# Patient Record
Sex: Female | Born: 1970 | Race: White | Hispanic: No | Marital: Married | Smoking: Never smoker
Health system: Southern US, Community
[De-identification: ages and names within clinical notes are randomized; demographics above are authoritative.]

## PROBLEM LIST (undated history)

## (undated) DIAGNOSIS — N6332 Unspecified lump in axillary tail of the left breast: Secondary | ICD-10-CM

## (undated) DIAGNOSIS — Z800 Family history of malignant neoplasm of digestive organs: Secondary | ICD-10-CM

## (undated) DIAGNOSIS — Z808 Family history of malignant neoplasm of other organs or systems: Secondary | ICD-10-CM

## (undated) DIAGNOSIS — K644 Residual hemorrhoidal skin tags: Secondary | ICD-10-CM

## (undated) DIAGNOSIS — Z803 Family history of malignant neoplasm of breast: Secondary | ICD-10-CM

## (undated) HISTORY — PX: TUBAL LIGATION: SHX77

## (undated) HISTORY — DX: Residual hemorrhoidal skin tags: K64.4

## (undated) HISTORY — DX: Unspecified lump in axillary tail of the left breast: N63.32

## (undated) HISTORY — DX: Family history of malignant neoplasm of other organs or systems: Z80.8

## (undated) HISTORY — DX: Family history of malignant neoplasm of digestive organs: Z80.0

## (undated) HISTORY — DX: Family history of malignant neoplasm of breast: Z80.3

---

## 2000-03-16 ENCOUNTER — Other Ambulatory Visit: Admission: RE | Admit: 2000-03-16 | Discharge: 2000-03-16 | Payer: Self-pay | Admitting: *Deleted

## 2001-03-12 ENCOUNTER — Other Ambulatory Visit: Admission: RE | Admit: 2001-03-12 | Discharge: 2001-03-12 | Payer: Self-pay | Admitting: Obstetrics and Gynecology

## 2001-10-20 ENCOUNTER — Encounter (INDEPENDENT_AMBULATORY_CARE_PROVIDER_SITE_OTHER): Payer: Self-pay | Admitting: Specialist

## 2001-10-20 ENCOUNTER — Inpatient Hospital Stay (HOSPITAL_COMMUNITY): Admission: AD | Admit: 2001-10-20 | Discharge: 2001-10-23 | Payer: Self-pay | Admitting: Obstetrics and Gynecology

## 2001-10-26 ENCOUNTER — Inpatient Hospital Stay (HOSPITAL_COMMUNITY): Admission: AD | Admit: 2001-10-26 | Discharge: 2001-10-26 | Payer: Self-pay | Admitting: Obstetrics and Gynecology

## 2001-11-25 ENCOUNTER — Other Ambulatory Visit: Admission: RE | Admit: 2001-11-25 | Discharge: 2001-11-25 | Payer: Self-pay | Admitting: Obstetrics and Gynecology

## 2003-05-22 ENCOUNTER — Other Ambulatory Visit: Admission: RE | Admit: 2003-05-22 | Discharge: 2003-05-22 | Payer: Self-pay | Admitting: Obstetrics and Gynecology

## 2003-06-24 ENCOUNTER — Encounter: Admission: RE | Admit: 2003-06-24 | Discharge: 2003-06-24 | Payer: Self-pay | Admitting: Obstetrics and Gynecology

## 2005-10-08 ENCOUNTER — Inpatient Hospital Stay (HOSPITAL_COMMUNITY): Admission: AD | Admit: 2005-10-08 | Discharge: 2005-10-08 | Payer: Self-pay | Admitting: *Deleted

## 2005-10-15 ENCOUNTER — Inpatient Hospital Stay (HOSPITAL_COMMUNITY): Admission: AD | Admit: 2005-10-15 | Discharge: 2005-10-18 | Payer: Self-pay | Admitting: Obstetrics and Gynecology

## 2005-10-15 ENCOUNTER — Encounter (INDEPENDENT_AMBULATORY_CARE_PROVIDER_SITE_OTHER): Payer: Self-pay | Admitting: Specialist

## 2005-12-07 ENCOUNTER — Emergency Department (HOSPITAL_COMMUNITY): Admission: EM | Admit: 2005-12-07 | Discharge: 2005-12-07 | Payer: Self-pay | Admitting: Emergency Medicine

## 2007-02-19 ENCOUNTER — Encounter: Admission: RE | Admit: 2007-02-19 | Discharge: 2007-02-19 | Payer: Self-pay | Admitting: *Deleted

## 2008-03-24 ENCOUNTER — Ambulatory Visit: Payer: Self-pay | Admitting: Internal Medicine

## 2008-03-24 DIAGNOSIS — R141 Gas pain: Secondary | ICD-10-CM | POA: Insufficient documentation

## 2008-03-24 DIAGNOSIS — K625 Hemorrhage of anus and rectum: Secondary | ICD-10-CM | POA: Insufficient documentation

## 2008-03-24 DIAGNOSIS — R143 Flatulence: Secondary | ICD-10-CM

## 2008-03-24 DIAGNOSIS — R142 Eructation: Secondary | ICD-10-CM

## 2008-03-25 DIAGNOSIS — R198 Other specified symptoms and signs involving the digestive system and abdomen: Secondary | ICD-10-CM | POA: Insufficient documentation

## 2008-04-13 ENCOUNTER — Ambulatory Visit: Payer: Self-pay | Admitting: Internal Medicine

## 2008-04-14 ENCOUNTER — Telehealth: Payer: Self-pay | Admitting: Internal Medicine

## 2008-12-21 ENCOUNTER — Ambulatory Visit: Payer: Self-pay | Admitting: Internal Medicine

## 2009-01-01 ENCOUNTER — Ambulatory Visit: Payer: Self-pay | Admitting: Internal Medicine

## 2009-01-04 ENCOUNTER — Telehealth (INDEPENDENT_AMBULATORY_CARE_PROVIDER_SITE_OTHER): Payer: Self-pay | Admitting: *Deleted

## 2010-07-28 ENCOUNTER — Other Ambulatory Visit: Payer: Self-pay | Admitting: Dermatology

## 2010-09-23 NOTE — H&P (Signed)
[facility]  Patient:    Mariah Ruiz, Mariah Ruiz Visit Number: [id number] MRN: [id number]          Service Type: OBS Location: [facility] Attending Physician:  Maxie Better Dictated by:   Lenoard Aden, M.D. Admit Date:  10/20/2001                           History and Physical  CHIEF COMPLAINT:              Spontaneous rupture of membranes.  HISTORY OF PRESENT ILLNESS:   The patient is a 40 year old white female, gravida 1, para 0, EDD October 23, 2001, at 39+ weeks who presents with thick meconium and spontaneous rupture of membranes at 3:00 today.  Pregnancy complicated by GBS bacteriuria.  PAST MEDICAL HISTORY:         No medical or surgical hospitalizations.  OBSTETRIC HISTORY:            Noncontributory.  PAST SURGICAL HISTORY:        History of wisdom tooth extraction one year ago.  FAMILY HISTORY:               Of lung, colon, and breast CA.  Family history also of chronic hypertension in mother.  PRENATAL LABORATORY DATA:     Reveals a blood type of A-positive.  Rh antibody negative.  Rubella immune.  Hepatitis B surface negative.  HIV nonreactive. GC and Chlamydia negative.  PHYSICAL EXAMINATION:  GENERAL:                      She is a well-developed and well-nourished white female in no apparent distress.  HEENT:                        Normal.  LUNGS:                        Clear.  HEART:                        Regular rhythm.  ABDOMEN:                      Soft, gravid, and nontender.  Estimated fetal weight 7 pounds.  CERVIX:                       Is fingertip to one, 80% and firm, vertex -1. IUPC placed for amnioinfusion.  Thick meconium noted on examination.  Vertex confirmed by ultrasound.  IMPRESSION:                   Term intrauterine pregnancy with meconium                               spontaneous rupture of membranes.  PLAN:                         Watch fetal heart rate closely, IUPC placed for amnioinfusion,  Pitocin augmentation, penicillin protocol, anticipate attempts at labor induction and vaginal delivery. Dictated by:   Lenoard Aden, M.D. Attending Physician:  Maxie Better DD:  10/20/01 TD:  10/20/01 Job: [id number]

## 2010-09-23 NOTE — Op Note (Signed)
[facility]  Patient:    Mariah Ruiz, Mariah Ruiz Visit Number: [id number] MRN: [id number]          Service Type: OBS Location: 910A 9120 01 Attending Physician:  Maxie Better Dictated by:   Lenoard Aden, M.D. Proc. Date: 10/20/01 Admit Date:  10/20/2001   CC:         [facility]   Operative Report  PREOPERATIVE DIAGNOSES:       A 39-week Intrauterine pregnancy, nonreassuring fetal heart rate, meconium-stained amniotic fluid.  POSTOPERATIVE DIAGNOSES:      A 39-week Intrauterine pregnancy, nonreassuring fetal heart rate, meconium-stained amniotic fluid.  PROCEDURE:                    Urgent low-segment transverse cesarean section.  SURGEON:                      Lenoard Aden, M.D.  ASSISTANT:                    None.  ANESTHESIA:                   Epidural by Hatchett.  ESTIMATED BLOOD LOSS:         1000 cc.  COMPLICATIONS:                None.  DRAINS:                       Foley.  DISPOSITION:                  Patient to recovery in good condition.  FINDINGS:                     Full term living female, occipitoposterior position.  Nuchal cord x3. DeLee suctioning performed. Placenta anterior and removed manually intact. Cord pH of 7.32, Apgars of 7 and 9. Pediatricians in attendance.  DESCRIPTION OF PROCEDURE:     After being appraised of the risks of anesthesia, infection, bleeding, expectant managment versus proceeding with surgical treatment, the patient was brought to the operating room where she was administered a dosing of her epidural anesthetic without complications, prepped and draped in the usual sterile fashion. Foley catheter placed. After achieving adequate anesthesia, dilute Marcaine solution placed in the area of the Pfannenstiel skin incision, a skin incision made with the scalpel, carried down to the fascia, which was nicked in the midline and opened transversely using Mayo scissors.  Rectus muscles  dissected sharply in the midline and the midportion, peritoneum entered sharply. Bladder blade placed. The visceral peritoneum scored in a smiley fashion, uterus scored in a smiley fashion. Delivery of a full-term living female from occipitoposterior position, handed to the pediatricians in attendance, receiving Apgars of 7 and 9. Cord blood collected. Cord pH of 7.32 noted. Nuchal cord x3 was reduced prior to delivery of the fetus. The uterus was exteriorized. Normal tubes and ovaries are noted. The uterus is curetted using a dry lap pack. Bladder flap inspected. The uterus was closed using a 0 Monocryl in a continuous running fashion. A second imbricating layer is placed. Good hemostasis is achieved. Bladder flap is inspected. There is a bleeder in the midline of the uterine incision, which is reinforced using the Monocryl suture. After this, pericolic gutter irrigated. All blood clots were subsequently removed. After achieving adequate hemostasis, the fascia then closed using a 0 Vicryl in  a continuous running fashion. The skin was closed using staples. The patient tolerated the procedure well and was transferred to recovery in good condition. Dictated by:   Lenoard Aden, M.D. Attending Physician:  Maxie Better DD:  10/20/01 TD:  10/22/01 Job: [id number]

## 2010-09-23 NOTE — Discharge Summary (Signed)
NAME:  Mariah Ruiz, Mariah Ruiz NO.:  [id number]   MEDICAL RECORD NO.:  [id number]          PATIENT TYPE:  INP   LOCATION:  9142                          FACILITY:  [facility]   PHYSICIAN:  Lenoard Aden, M.D.DATE OF BIRTH:  [birth year]   DATE OF ADMISSION:  10/15/2005  DATE OF DISCHARGE:  10/18/2005                                 DISCHARGE SUMMARY   ADMISSION DIAGNOSES:  1.  At [redacted] weeks gestation with spontaneous rupture of membranes.  2.  Planned repeat cesarean delivery with tubal ligation.   DISCHARGE DIAGNOSIS:  Postoperative day three status post cesarean section  and bilateral tubal ligation, stable status.   PATIENT PRESENTATION:  Patient presents to maternity admission's unit at  1:35 a.m.  The patient is a gravida 2, para 1, EDC of October 31, 2005 at 37  weeks and 5 days with spontaneous rupture of membranes at 0115.  The patient  is for planned repeat cesarean section with bilateral tubal ligation.  Vital  signs 98.4, pulse 107, respirations 18, blood pressure 136/84.  The patient  has no known drug allergies.  See operative note for delivery information,  repeat cesarean section and bilateral tubal ligation.  The patient delivered  a female, Apgar's 9 and 9.  Birth weight of 6 pounds 11 ounces by Dr. Lenoard Aden.   HOSPITAL COURSE:  Postoperative day one, status post cesarean section and  tubal ligation.  The patient is in stable status.  Vital signs are stable.  The patient is afebrile.   LABORATORY DATA:  On postoperative day one, white blood cell count is 10.8,  hemoglobin is 10.5, hematocrit is 30.6, platelet count is stable at 232.  RPR is nonreactive.   Hospital day two, postoperative day two, up ad lib, tolerating diet,  breastfeeding without difficulty.  Vital signs are stable and afebrile.  Physical exam is normal.   Hospital day three, postoperative day three on the date of discharge, the  patient is stable.  She is three days postoperative  surgery, tolerating  diet, up ad lib, breastfeeding without difficulty.  Physical exam is within  normal limits.  Incision is well approximated.  No redness, no drainage.  Staples are removed.  Steri-Strips applied.  The patient is discharged home  in stable status.   ACTIVITY:  Postoperative precautions.  No heavy lifting.  No driving for the  next two weeks.   DIET:  Regular.   MEDICATIONS:  Include prenatal vitamin once daily, ibuprofen 800 mg every  eight hours as needed for discomfort, Percocet 1-2 tablets every four to six  hours as needed for pain.  The patient is to follow up at [facility] at four weeks postoperative for incision check and  baby blues postpartum depression evaluation.  Discharge instruction booklet  discharged home with patient.  Reviewed.  No questions at discharge.  Newborn is circumcised prior to discharge.  Newborn is in stable status.      Marlinda Mike, C.N.M.      Lenoard Aden, M.D.  Electronically Signed    TB/MEDQ  D:  12/11/2005  T:  12/12/2005  Job:  [id number]

## 2010-09-23 NOTE — Op Note (Signed)
NAME:  Mariah Ruiz, Mariah Ruiz NO.:  [id number]   MEDICAL RECORD NO.:  [id number]          PATIENT TYPE:  INP   LOCATION:  9142                          [facility]ILITY[facility]  [facility]   PHYSICIAN:  Lenoard Aden, M.D.DATE OF BIRTH:  10/02/1970   DATE OF PROCEDURE:  10/15/2005  DATE OF DISCHARGE:                                 OPERATIVE REPORT   PREOPERATIVE DIAGNOSIS:  37-week spontaneous rupture of membranes, previous  cesarean section, for repeat tubal ligation.   POSTOPERATIVE DIAGNOSIS:  37-week spontaneous rupture of membranes, previous  cesarean section, for repeat tubal ligation.   OPERATION PERFORMED:  Repeat low segment transverse cesarean section and  tubal ligation.   SURGEON:  Lenoard Aden, M.D.   ANESTHESIA:  Spinal by Raul Del, M.D.   ESTIMATED BLOOD LOSS:  1000 mL.   COMPLICATIONS:  None.   DRAINS:  Foley catheter.   COUNTS:  Correct.   Patient to recovery in good condition.   FINDINGS:  A full term living female 6 pounds 10 ounces, Apgars 9 and 9,  placenta anterior intact.  Three vessel cord noted.  Normal tubes, normal  ovaries.  Small subserosal fundal fibroid noted.   DESCRIPTION OF PROCEDURE:  After being apprised of risks of anesthesia,  infection, bleeding, injury to abdominal organs, need for repair, delayed  versus immediate complications to include bowel and bladder injury, the  patient was brought to the operating room where she was administered a  spinal anesthetic without complications, prepped and draped in the usual  sterile fashion.  Foley catheter placed.  After achieving adequate  anesthesia, dilute Marcaine solution placed.  Pfannenstiel skin incision  made with a scalpel, carried down to fascia which was nicked in the midline  __________ using Mayo scissors.  Rectus muscles dissected sharply in the  midline.  Peritoneum entered sharply, bladder blade was placed.  Visceral  peritoneum scored sharply, lower uterine segment  curved hysterotomy incision  made, atraumatic delivery full term living female, handed to pediatrician in  attendance, Apgars as noted.  Cord blood collected.  Placenta delivered  manually intact, 3-vessel cord, uterus exteriorized curetted using a dry lap  pack and closed in one layer using a 0 Monocryl suture.  Additional sutures  placed in the left and right lateral edge to obtain hemostasis.  After  obtaining a proper consent once again, failure risks of tubal ligation noted  5 to 10 per 1000, the right tube was then traced out to the right fimbriated  end where it was identified.  Ampullary isthmic portion of the tube was  identified.  Avascular portion of the mesosalpinx was cauterized creating a  window.  0 ties were placed distally and proximally.  Tubal segment removed.  Tubal lumens were visualized and cauterized.  The same exact procedure was  performed on the left tube.  Good hemostasis was noted on both tubal  operative sites.  At this time irrigation was accomplished.  Incision and bladder flap  reinspected.  Good hemostasis appreciated.  Fascia then closed using 0  Monocryl in running fashion.  Skin was closed using staples.  The patient  tolerated the procedure well and is transferred to recovery room in good  condition.      Lenoard Aden, M.D.  Electronically Signed     RJT/MEDQ  D:  10/15/2005  T:  10/16/2005  Job:  [id number]

## 2013-05-23 ENCOUNTER — Other Ambulatory Visit: Payer: Self-pay

## 2013-05-23 DIAGNOSIS — Z1231 Encounter for screening mammogram for malignant neoplasm of breast: Secondary | ICD-10-CM

## 2013-06-13 ENCOUNTER — Ambulatory Visit
Admission: RE | Admit: 2013-06-13 | Discharge: 2013-06-13 | Disposition: A | Discharge status: Home / Self Care | Payer: BC Managed Care – PPO | Source: Ambulatory Visit

## 2013-06-13 DIAGNOSIS — Z1231 Encounter for screening mammogram for malignant neoplasm of breast: Secondary | ICD-10-CM

## 2014-09-04 ENCOUNTER — Encounter: Payer: Self-pay | Admitting: Internal Medicine

## 2015-07-07 ENCOUNTER — Other Ambulatory Visit: Payer: Self-pay | Admitting: Obstetrics and Gynecology

## 2015-07-07 DIAGNOSIS — R928 Other abnormal and inconclusive findings on diagnostic imaging of breast: Secondary | ICD-10-CM

## 2015-07-12 ENCOUNTER — Ambulatory Visit
Admission: RE | Admit: 2015-07-12 | Discharge: 2015-07-12 | Disposition: A | Discharge status: Home / Self Care | Payer: BLUE CROSS/BLUE SHIELD | Source: Ambulatory Visit | Attending: Obstetrics and Gynecology | Admitting: Obstetrics and Gynecology

## 2015-07-12 DIAGNOSIS — R928 Other abnormal and inconclusive findings on diagnostic imaging of breast: Secondary | ICD-10-CM

## 2016-02-25 DIAGNOSIS — F4323 Adjustment disorder with mixed anxiety and depressed mood: Secondary | ICD-10-CM | POA: Diagnosis not present

## 2016-03-03 DIAGNOSIS — F4323 Adjustment disorder with mixed anxiety and depressed mood: Secondary | ICD-10-CM | POA: Diagnosis not present

## 2016-03-10 DIAGNOSIS — F4323 Adjustment disorder with mixed anxiety and depressed mood: Secondary | ICD-10-CM | POA: Diagnosis not present

## 2016-03-24 DIAGNOSIS — F4323 Adjustment disorder with mixed anxiety and depressed mood: Secondary | ICD-10-CM | POA: Diagnosis not present

## 2016-04-21 DIAGNOSIS — F4323 Adjustment disorder with mixed anxiety and depressed mood: Secondary | ICD-10-CM | POA: Diagnosis not present

## 2016-08-15 DIAGNOSIS — Z6820 Body mass index (BMI) 20.0-20.9, adult: Secondary | ICD-10-CM | POA: Diagnosis not present

## 2016-08-15 DIAGNOSIS — Z01419 Encounter for gynecological examination (general) (routine) without abnormal findings: Secondary | ICD-10-CM | POA: Diagnosis not present

## 2016-08-21 ENCOUNTER — Other Ambulatory Visit: Payer: Self-pay | Admitting: Family Medicine

## 2016-08-21 ENCOUNTER — Other Ambulatory Visit: Payer: Self-pay | Admitting: Obstetrics and Gynecology

## 2016-08-21 DIAGNOSIS — R234 Changes in skin texture: Secondary | ICD-10-CM

## 2016-08-28 ENCOUNTER — Encounter: Payer: Self-pay | Admitting: Internal Medicine

## 2016-08-30 ENCOUNTER — Ambulatory Visit
Admission: RE | Admit: 2016-08-30 | Discharge: 2016-08-30 | Disposition: A | Discharge status: Home / Self Care | Payer: BLUE CROSS/BLUE SHIELD | Source: Ambulatory Visit | Attending: Obstetrics and Gynecology | Admitting: Obstetrics and Gynecology

## 2016-08-30 ENCOUNTER — Other Ambulatory Visit: Payer: Self-pay | Admitting: Obstetrics and Gynecology

## 2016-08-30 DIAGNOSIS — N6489 Other specified disorders of breast: Secondary | ICD-10-CM | POA: Diagnosis not present

## 2016-08-30 DIAGNOSIS — R234 Changes in skin texture: Secondary | ICD-10-CM

## 2016-08-30 DIAGNOSIS — R928 Other abnormal and inconclusive findings on diagnostic imaging of breast: Secondary | ICD-10-CM | POA: Diagnosis not present

## 2016-09-27 ENCOUNTER — Encounter: Payer: Self-pay | Admitting: Internal Medicine

## 2016-09-27 ENCOUNTER — Ambulatory Visit (INDEPENDENT_AMBULATORY_CARE_PROVIDER_SITE_OTHER): Payer: BLUE CROSS/BLUE SHIELD | Admitting: Internal Medicine

## 2016-09-27 VITALS — BP 84/60 | HR 82 | Ht 66.0 in | Wt 122.0 lb

## 2016-09-27 DIAGNOSIS — K6289 Other specified diseases of anus and rectum: Secondary | ICD-10-CM

## 2016-09-27 DIAGNOSIS — Z1211 Encounter for screening for malignant neoplasm of colon: Secondary | ICD-10-CM | POA: Diagnosis not present

## 2016-09-27 DIAGNOSIS — Z800 Family history of malignant neoplasm of digestive organs: Secondary | ICD-10-CM

## 2016-09-27 NOTE — Progress Notes (Signed)
HISTORY OF PRESENT ILLNESS:  Mariah Ruiz is a 46 y.o. female who presents with a chief complaint of family history of colon cancer and the need for colon cancer screening. Secondary complaint of intermittent rectal irritation. The patient underwent colonoscopy 01/01/2009 to evaluate minor rectal bleeding and a family history of colon cancer in her grandparent as well as family history of colon polyps in her parents and sister at age 29. The colon and terminal ileum were normal. No polyps. Bleeding was felt likely secondary to hemorrhoids or small tear. No significant abnormalities on examination that day. Unfortunately, her brother, age 19, was recently diagnosed with stage IV colon cancer. He has not been screened. Patient does mention that her bowel habits are regular. She will notice itching discomfort and occasional minor red blood after defecation. She describes a burning or stinging sensation seeming like a small tear. No other complaints except for slight lower abdominal wall discomfort with Valsalva. She questions hernia  REVIEW OF SYSTEMS:  All non-GI ROS negative entirely upon comprehensive review  Past Medical History:  Diagnosis Date  . External hemorrhoids   . Mass of axillary tail of left breast     Past Surgical History:  Procedure Laterality Date  . CESAREAN SECTION    . TUBAL LIGATION      Social History FUMIE FIALLO  reports that she has never smoked. She has never used smokeless tobacco. She reports that she drinks about 1.2 oz of alcohol per week . She reports that she does not use drugs.  family history includes Colon cancer in her paternal grandmother; Colon cancer (age of onset: 49) in her brother; Melanoma in her sister; Stroke in her father; Ulcerative colitis (age of onset: 4) in her son.  Allergies  Allergen Reactions  . Shrimp [Shellfish Allergy]   . Latex Rash    Latex was used with her last delivery and she had no problems       PHYSICAL  EXAMINATION: Vital signs: BP (!) 84/60   Pulse 82   Ht 5\' 6"  (1.676 m)   Wt 122 lb (55.3 kg)   LMP 09/05/2016 (Approximate)   BMI 19.69 kg/m   Constitutional: generally well-appearing, no acute distress Psychiatric: alert and oriented x3, cooperative Eyes: extraocular movements intact, anicteric, conjunctiva pink Mouth: oral pharynx moist, no lesions Neck: supple no lymphadenopathy Cardiovascular: heart regular rate and rhythm, no murmur Lungs: clear to auscultation bilaterally Abdomen: soft, nontender, nondistended, no obvious ascites, no peritoneal signs, normal bowel sounds, no organomegaly. Slight abdominal wall via stasis with Valsalva Rectal:Deferred until colonoscopy Extremities: no clubbing cyanosis or lower extremity edema bilaterally Skin: no lesions on visible extremities Neuro: No focal deficits. Cranial nerves intact  ASSESSMENT:  #1. Family history of colon cancer as described. This patient is appropriate for colon cancer screening #2. Minor rectal irritation. Sounds like small fissure #3. Mild abdominal wall via stasis  PLAN:  #1. Screen colonoscopy.The nature of the procedure, as well as the risks, benefits, and alternatives were carefully and thoroughly reviewed with the patient. Ample time for discussion and questions allowed. The patient understood, was satisfied, and agreed to proceed. #2. Address perirectal complaints post colonoscopy

## 2016-09-27 NOTE — Patient Instructions (Signed)
We will call you to schedule your colonoscopy 

## 2016-11-07 ENCOUNTER — Encounter: Payer: Self-pay | Admitting: Internal Medicine

## 2016-12-25 ENCOUNTER — Ambulatory Visit (AMBULATORY_SURGERY_CENTER): Payer: Self-pay

## 2016-12-25 VITALS — Ht 66.0 in | Wt 122.4 lb

## 2016-12-25 DIAGNOSIS — Z800 Family history of malignant neoplasm of digestive organs: Secondary | ICD-10-CM

## 2016-12-25 MED ORDER — NA SULFATE-K SULFATE-MG SULF 17.5-3.13-1.6 GM/177ML PO SOLN: 1.0000 | Freq: Once | ORAL | 0 refills | Status: AC

## 2016-12-25 NOTE — Progress Notes (Signed)
Denies allergies to eggs or soy products. Denies complication of anesthesia or sedation. Denies use of weight loss medication. Denies use of O2.   Emmi instructions declined.  

## 2016-12-26 ENCOUNTER — Encounter: Payer: Self-pay | Admitting: Internal Medicine

## 2017-01-09 ENCOUNTER — Encounter: Payer: Self-pay | Admitting: Internal Medicine

## 2017-01-09 ENCOUNTER — Ambulatory Visit (AMBULATORY_SURGERY_CENTER): Payer: BLUE CROSS/BLUE SHIELD | Admitting: Internal Medicine

## 2017-01-09 VITALS — BP 101/68 | HR 68 | Temp 98.2°F | Resp 17 | Ht 66.0 in | Wt 122.0 lb

## 2017-01-09 DIAGNOSIS — Z1212 Encounter for screening for malignant neoplasm of rectum: Secondary | ICD-10-CM | POA: Diagnosis not present

## 2017-01-09 DIAGNOSIS — Z800 Family history of malignant neoplasm of digestive organs: Secondary | ICD-10-CM | POA: Diagnosis not present

## 2017-01-09 DIAGNOSIS — Z1211 Encounter for screening for malignant neoplasm of colon: Secondary | ICD-10-CM

## 2017-01-09 MED ORDER — SODIUM CHLORIDE 0.9 % IV SOLN: 500.0000 mL | INTRAVENOUS | Status: DC

## 2017-01-09 NOTE — Progress Notes (Signed)
To recovery, report to RN, VSS. 

## 2017-01-09 NOTE — Progress Notes (Signed)
Pt's states no medical or surgical changes since previsit or office visit. 

## 2017-01-09 NOTE — Op Note (Signed)
[facility] Patient Name: Mariah Ruiz Procedure Date: 01/09/2017 9:17 AM MRN: [id number] Endoscopist: Docia Chuck. Henrene Pastor , MD Age: 46 Referring MD:  Date of Birth: 01-03-1971 Gender: Female Account #: [id number] Procedure:                Colonoscopy Indications:              Screening in patient at increased risk: Colorectal                            cancer in brother before age 68 (age 63); sister                            with polyps in 73s Medicines:                Monitored Anesthesia Care Procedure:                Pre-Anesthesia Assessment:                           - Prior to the procedure, a History and Physical                            was performed, and patient medications and                            allergies were reviewed. The patient's tolerance of                            previous anesthesia was also reviewed. The risks                            and benefits of the procedure and the sedation                            options and risks were discussed with the patient.                            All questions were answered, and informed consent                            was obtained. Prior Anticoagulants: The patient has                            taken no previous anticoagulant or antiplatelet                            agents. ASA Grade Assessment: I - A normal, healthy                            patient. After reviewing the risks and benefits,                            the patient was deemed in satisfactory condition to  undergo the procedure.                           After obtaining informed consent, the colonoscope                            was passed under direct vision. Throughout the                            procedure, the patient's blood pressure, pulse, and                            oxygen saturations were monitored continuously. The                            Colonoscope was introduced through the anus and                         advanced to the the cecum, identified by                            appendiceal orifice and ileocecal valve. The                            ileocecal valve, appendiceal orifice, and rectum                            were photographed. The quality of the bowel                            preparation was excellent. The colonoscopy was                            performed without difficulty. The patient tolerated                            the procedure well. The bowel preparation used was                            SUPREP. Scope In: 9:29:57 AM Scope Out: 9:42:11 AM Scope Withdrawal Time: 0 hours 8 minutes 59 seconds  Total Procedure Duration: 0 hours 12 minutes 14 seconds  Findings:                 A few medium-mouthed diverticula were found in the                            proximal sigmoid colon and ascending colon.                           The exam was otherwise without abnormality on                            direct and retroflexion views. Complications:            No immediate complications. Estimated blood loss:  None. Estimated Blood Loss:     Estimated blood loss: none. Impression:               - Diverticulosis in the proximal sigmoid colon and                            in the ascending colon.                           - The examination was otherwise normal on direct                            and retroflexion views.                           - Small anal fissure. Recommendation:           - Repeat colonoscopy in 5 years for screening                            purposes.                           - Patient has a contact number available for                            emergencies. The signs and symptoms of potential                            delayed complications were discussed with the                            patient. Return to normal activities tomorrow.                            Written discharge instructions were provided  to the                            patient.                           - Resume previous diet.                           - Recommend fiber supplementation with Metamucil                            one to 2 tablespoons daily in 12-14 ounces of water                            or juice. Docia Chuck. Henrene Pastor, MD 01/09/2017 9:47:51 AM This report has been signed electronically.

## 2017-01-09 NOTE — Patient Instructions (Signed)
YOU HAD AN ENDOSCOPIC PROCEDURE TODAY AT [facility]:   Refer to the procedure report that was given to you for any specific questions about what was found during the examination.  If the procedure report does not answer your questions, please call your gastroenterologist to clarify.  If you requested that your care partner not be given the details of your procedure findings, then the procedure report has been included in a sealed envelope for you to review at your convenience later.  YOU SHOULD EXPECT: Some feelings of bloating in the abdomen. Passage of more gas than usual.  Walking can help get rid of the air that was put into your GI tract during the procedure and reduce the bloating. If you had a lower endoscopy (such as a colonoscopy or flexible sigmoidoscopy) you may notice spotting of blood in your stool or on the toilet paper. If you underwent a bowel prep for your procedure, you may not have a normal bowel movement for a few days.  Please Note:  You might notice some irritation and congestion in your nose or some drainage.  This is from the oxygen used during your procedure.  There is no need for concern and it should clear up in a day or so.  SYMPTOMS TO REPORT IMMEDIATELY:   Following lower endoscopy (colonoscopy or flexible sigmoidoscopy):  Excessive amounts of blood in the stool  Significant tenderness or worsening of abdominal pains  Swelling of the abdomen that is new, acute  Fever of 100F or higher  For urgent or emergent issues, a gastroenterologist can be reached at any hour by calling [phone].   DIET:  We do recommend a small meal at first, but then you may proceed to your regular diet.  Drink plenty of fluids but you should avoid alcoholic beverages for 24 hours.  MEDICATIONS: Recommend fiber supplementation with Metamucil 1 to 2 tablespoons daily in 12-14 ounces of water or juice.  Please see handouts given to you by your recovery  nurse.  ACTIVITY:  You should plan to take it easy for the rest of today and you should NOT DRIVE or use heavy machinery until tomorrow (because of the sedation medicines used during the test).    FOLLOW UP: Our staff will call the number listed on your records the next business day following your procedure to check on you and address any questions or concerns that you may have regarding the information given to you following your procedure. If we do not reach you, we will leave a message.  However, if you are feeling well and you are not experiencing any problems, there is no need to return our call.  We will assume that you have returned to your regular daily activities without incident.  If any biopsies were taken you will be contacted by phone or by letter within the next 1-3 weeks.  Please call us at [phone] if you have not heard about the biopsies in 3 weeks.   Thank you for allowing [place] to provide for your healthcare needs today.  SIGNATURES/CONFIDENTIALITY: You and/or your care partner have signed paperwork which will be entered into your electronic medical record.  These signatures attest to the fact that that the information above on your After Visit Summary has been reviewed and is understood.  Full responsibility of the confidentiality of this discharge information lies with you and/or your care-partner.

## 2017-01-10 ENCOUNTER — Telehealth: Payer: Self-pay | Admitting: *Deleted

## 2017-01-10 ENCOUNTER — Telehealth: Payer: Self-pay

## 2017-01-10 NOTE — Telephone Encounter (Signed)
Called [phone] and left a messaged we tried to reach pt for a follow up call. maw

## 2017-01-10 NOTE — Telephone Encounter (Signed)
  Follow up Call-  Call back number 01/09/2017  Post procedure Call Back phone  # [phone]  Permission to leave phone message Yes  Some recent data might be hidden     Patient questions:  Do you have a fever, pain , or abdominal swelling? No. Pain Score  0 *  Have you tolerated food without any problems? Yes.    Have you been able to return to your normal activities? Yes.    Do you have any questions about your discharge instructions: Diet   No. Medications  No. Follow up visit  No.  Do you have questions or concerns about your Care? No.  Actions: * If pain score is 4 or above: No action needed, pain <4.

## 2017-08-20 DIAGNOSIS — Z6820 Body mass index (BMI) 20.0-20.9, adult: Secondary | ICD-10-CM | POA: Diagnosis not present

## 2017-08-20 DIAGNOSIS — Z01419 Encounter for gynecological examination (general) (routine) without abnormal findings: Secondary | ICD-10-CM | POA: Diagnosis not present

## 2017-08-21 DIAGNOSIS — Z13220 Encounter for screening for lipoid disorders: Secondary | ICD-10-CM | POA: Diagnosis not present

## 2017-08-21 DIAGNOSIS — N926 Irregular menstruation, unspecified: Secondary | ICD-10-CM | POA: Diagnosis not present

## 2017-08-21 DIAGNOSIS — Z0000 Encounter for general adult medical examination without abnormal findings: Secondary | ICD-10-CM | POA: Diagnosis not present

## 2017-09-03 ENCOUNTER — Other Ambulatory Visit: Payer: Self-pay | Admitting: Obstetrics and Gynecology

## 2017-09-03 DIAGNOSIS — Z1231 Encounter for screening mammogram for malignant neoplasm of breast: Secondary | ICD-10-CM

## 2017-09-19 ENCOUNTER — Ambulatory Visit: Payer: BLUE CROSS/BLUE SHIELD

## 2018-01-31 ENCOUNTER — Ambulatory Visit
Admission: RE | Admit: 2018-01-31 | Discharge: 2018-01-31 | Disposition: A | Discharge status: Home / Self Care | Payer: BLUE CROSS/BLUE SHIELD | Source: Ambulatory Visit | Attending: Obstetrics and Gynecology | Admitting: Obstetrics and Gynecology

## 2018-01-31 DIAGNOSIS — D225 Melanocytic nevi of trunk: Secondary | ICD-10-CM | POA: Diagnosis not present

## 2018-01-31 DIAGNOSIS — Z1231 Encounter for screening mammogram for malignant neoplasm of breast: Secondary | ICD-10-CM

## 2018-01-31 DIAGNOSIS — D2261 Melanocytic nevi of right upper limb, including shoulder: Secondary | ICD-10-CM | POA: Diagnosis not present

## 2018-01-31 DIAGNOSIS — D224 Melanocytic nevi of scalp and neck: Secondary | ICD-10-CM | POA: Diagnosis not present

## 2018-01-31 DIAGNOSIS — D2262 Melanocytic nevi of left upper limb, including shoulder: Secondary | ICD-10-CM | POA: Diagnosis not present

## 2018-02-18 ENCOUNTER — Other Ambulatory Visit: Payer: Self-pay | Admitting: *Deleted

## 2018-02-18 DIAGNOSIS — Z800 Family history of malignant neoplasm of digestive organs: Secondary | ICD-10-CM

## 2018-02-18 NOTE — Progress Notes (Unsigned)
Brother with stage 4 colon cancer and +genetic screen (BRCA?) Pt wants genetic testing.

## 2018-03-27 ENCOUNTER — Ambulatory Visit: Payer: BLUE CROSS/BLUE SHIELD

## 2018-03-27 DIAGNOSIS — Z803 Family history of malignant neoplasm of breast: Secondary | ICD-10-CM | POA: Diagnosis not present

## 2018-03-27 DIAGNOSIS — Z808 Family history of malignant neoplasm of other organs or systems: Secondary | ICD-10-CM | POA: Diagnosis not present

## 2018-03-27 DIAGNOSIS — Z1379 Encounter for other screening for genetic and chromosomal anomalies: Secondary | ICD-10-CM

## 2018-03-27 DIAGNOSIS — Z800 Family history of malignant neoplasm of digestive organs: Secondary | ICD-10-CM | POA: Diagnosis not present

## 2018-03-27 NOTE — Progress Notes (Signed)
Pt here for [facility] blood draw. Blood draw and pt is aware we will call with results

## 2018-04-03 ENCOUNTER — Encounter: Payer: Self-pay | Admitting: Obstetrics & Gynecology

## 2018-04-03 ENCOUNTER — Telehealth: Payer: Self-pay | Admitting: Obstetrics & Gynecology

## 2018-04-03 NOTE — Telephone Encounter (Signed)
Reviewed family history with Mariah Ruiz today.  Family history tab updated.  Pt has had My Risk test through [facility] and awaiting results.

## 2018-04-17 ENCOUNTER — Encounter: Payer: Self-pay | Admitting: *Deleted

## 2018-04-17 NOTE — Progress Notes (Signed)
Copy of My Risk [facility] genetic testing received and routed to Dr Gala Romney.  Pt copy mailed to pt's home address.

## 2018-04-19 ENCOUNTER — Other Ambulatory Visit: Payer: Self-pay | Admitting: *Deleted

## 2018-04-19 DIAGNOSIS — Z809 Family history of malignant neoplasm, unspecified: Secondary | ICD-10-CM

## 2018-04-22 NOTE — Progress Notes (Signed)
Pt My Risk results are positive and Pt needs genetic counseling appt @ [facility] and appt to be made with Dr Donne Hazel.  Orders for the referral made.

## 2018-06-18 ENCOUNTER — Encounter: Payer: Self-pay | Admitting: Genetic Counselor

## 2018-07-01 ENCOUNTER — Encounter: Payer: Self-pay | Admitting: Genetic Counselor

## 2018-07-01 ENCOUNTER — Inpatient Hospital Stay: Payer: BLUE CROSS/BLUE SHIELD | Attending: Genetic Counselor | Admitting: Genetic Counselor

## 2018-07-01 DIAGNOSIS — Z808 Family history of malignant neoplasm of other organs or systems: Secondary | ICD-10-CM | POA: Diagnosis not present

## 2018-07-01 DIAGNOSIS — Z800 Family history of malignant neoplasm of digestive organs: Secondary | ICD-10-CM

## 2018-07-01 DIAGNOSIS — Z803 Family history of malignant neoplasm of breast: Secondary | ICD-10-CM | POA: Diagnosis not present

## 2018-07-01 NOTE — Progress Notes (Addendum)
REFERRING PROVIDER: Guss Bunde, MD [address], [place] [address]  PRIMARY PROVIDER: Brien Few, MD  PRIMARY REASON FOR VISIT:  1. Family history of breast cancer   2. Family history of melanoma   3. Family history of colon cancer      HISTORY OF PRESENT ILLNESS:   Mariah Ruiz, a 48 y.o. female, was seen for a [place] cancer genetics consultation at the request of Dr. Gala Romney due to a family history of cancer.  Mariah Ruiz presents to clinic today to discuss the possibility of a hereditary predisposition to cancer, genetic testing, and to further clarify her future cancer risks, as well as potential cancer risks for family members.   Mariah Ruiz is a 48 y.o. female with no personal history of cancer.  She underwent genetic testing at Dr. Claiborne Billings Leggett's office through [facility] panel and was identified with an RNF43 pathogenic mutation.    CANCER HISTORY:   No history exists.     HORMONAL RISK FACTORS:  Menarche was at age 3.  First live birth at age 83.  OCP use for approximately 0 years.  Ovaries intact: yes.  Hysterectomy: no.  Menopausal status: premenopausal.  HRT use: 0 years. Colonoscopy: yes; normal. Mammogram within the last year: yes. Number of breast biopsies: 0. Up to date with pelvic exams:  yes. Any excessive radiation exposure in the past:  no  Past Medical History:  Diagnosis Date  . External hemorrhoids   . Family history of breast cancer   . Family history of colon cancer   . Family history of melanoma   . Mass of axillary tail of left breast     Past Surgical History:  Procedure Laterality Date  . CESAREAN SECTION    . TUBAL LIGATION      Social History   Socioeconomic History  . Marital status: Married    Spouse name: Not on file  . Number of children: Not on file  . Years of education: Not on file  . Highest education level: Not on file  Occupational History  . Not on file  Social Needs  .  Financial resource strain: Not on file  . Food insecurity:    Worry: Not on file    Inability: Not on file  . Transportation needs:    Medical: Not on file    Non-medical: Not on file  Tobacco Use  . Smoking status: Never Smoker  . Smokeless tobacco: Never Used  Substance and Sexual Activity  . Alcohol use: Yes    Alcohol/week: 2.0 standard drinks    Types: 2 Standard drinks or equivalent per week    Comment: socially  . Drug use: No  . Sexual activity: Yes  Lifestyle  . Physical activity:    Days per week: Not on file    Minutes per session: Not on file  . Stress: Not on file  Relationships  . Social connections:    Talks on phone: Not on file    Gets together: Not on file    Attends religious service: Not on file    Active member of club or organization: Not on file    Attends meetings of clubs or organizations: Not on file    Relationship status: Not on file  Other Topics Concern  . Not on file  Social History Narrative  . Not on file     FAMILY HISTORY:  We obtained a detailed, 4-generation family history.  Significant diagnoses are  listed below: Family History  Problem Relation Age of Onset  . Stroke Father   . Colon polyps Father   . Melanoma Sister 37  . Colon cancer Paternal Grandmother 54  . Colon cancer Brother 48       stage 4  . Ulcerative colitis Son 8  . Colitis Son   . Breast cancer Paternal Aunt 72  . Cancer Maternal Grandmother        unknown  . Breast cancer Maternal Aunt 37  . Breast cancer Maternal Aunt 60  . Breast cancer Paternal Aunt 25  . Esophageal cancer Neg Hx   . Pancreatic cancer Neg Hx   . Rectal cancer Neg Hx   . Stomach cancer Neg Hx     The patient has two sons who are cancer free, however, her oldest son has ulcerative colitis diagnosed at age 74.  She has a brother and sister.  Her brother was diagnosed with Stage IV colon cancer at age 41 and her sister was diagnosed with melanoma at 48.  Her brother reportedly has had  Lynch syndrome testing, at least in his tumor, which was negative, but did indicate there was a BRAF mutation.  Her sister never had genetic testing for her melanoma.  Both parents are living.  The patient's mother is 12 and has not had cancer.  She has three sisters and two brothers.  Two sister have had breast cancer, both in their mid to late 67's.  One is deceased and the other reportedly has a BRCA mutation.  The maternal grandparents are deceased, the grandmother from possible lung cancer and the grandfather from an accident.  The patient's father is living and has a history of colon polyps.  He has two brothers and three sister.  Two of his sisters had breast cancer in their 84's.  His mother died of colon cancer at 28 and his father died young from Hepatitis and liver failure.  [name] is aware of previous family history of genetic testing for hereditary cancer risks. Patient's ancestors are of [place], [place] and [place] descent. There is no reported Ashkenazi Jewish ancestry. There is no known consanguinity.  GENETIC COUNSELING ASSESSMENT: Mariah Ruiz is a 48 y.o. female with a family history of colon and breast cancer, and melanoma which is somewhat suggestive of a hereditary cancer syndrome and predisposition to cancer. She underwent genetic testing through [facility] panel testing.  The [facility] gene panel offered by [employer] includes sequencing and deletion/duplication testing of the following 35 genes: APC, ATM, AXIN2, BARD1, BMPR1A, BRCA1, BRCA2, BRIP1, CHD1, CDK4, CDKN2A, CHEK2, EPCAM (large rearrangement only), HOXB13, (sequencing only), GALNT12, MLH1, MSH2, MSH3 (excluding repetitive portions of exon 1), MSH6, MUTYH, NBN, NTHL1, PALB2, PMS2, PTEN, RAD51C, RAD51D, RNF43, RPS20, SMAD4, STK11, and TP53. Sequencing was performed for select regions of POLE and POLD1, and large rearrangement analysis was performed for select regions of GREM1. 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  We, therefore, discussed and recommended the following at today's visit.   DISCUSSION: We discussed her risk for breast cancer, colon cancer and Melanoma.  These conversations have been separated based on the risk for cancer in which we discussed.  Colon Cancer: The patient has a brother and paternal grandmother who were diagnosed with colon cancer at a young age of onset, as well as her father who has had colon polyps.  About 5-7% of colon cancer is hereditary, with most cases due to  Lynch syndrome.  The patient reports that her brother had genetic testing for Lynch syndrome in his tumor which indicated that he did not have Lynch syndrome, but did find a BRAF mutation.  We discussed that tumor testing can identify about 90% of cases of Lynch syndrome through the tumor but that about 10% of cases go undiagnosed through the tumor.  Based on his young age of onset and her paternal grandmother's young age of onset, he should consider genetic testing for Lynch syndrome.  Mariah Ruiz had genetic testing through [facility] panel testing that did not identify a genetic mutation within the Lynch syndrome genes.  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  SPS is typically a clinical diagnosis based on the number, size and location of colon polyps.  Mariah Ruiz has had colonoscopies in the past that have been clear with no polyps.  Therefore, based on NCCN guidelines, we would not recommend screening differences from those she is currently undergoing based on a family history of early onset colon cancer.  According to v. 3.2019 NCCN guidelines (AffordableShare.com.br.pdf), a clinical diagnosis of serrated polyposis is considered in an individual who meets at least one of the following empiric criteria:  1. At least 5 serrated polyps proximal  to the sigmoid colon with 2 or more of these being >10 mm 2. Any number of serrated polyps proximal to the sigmoid colon in an individual who has a first-degree relative with serrated polyposis,  3. >/= 20 serrated polyps of any size, but distributed throughout the colon.  For the majority of patients with SPS, no causative gene is identifiable.  Pathogenic variants in RNF43 have been identified as a rare cause of serrated polyposis.  The risk for colon cancer in this syndrome is elevated, although the precise risk remains to be defined. Occasionally, more than one affected case of serrated polyposis is seen in a family, and therefore family members should undergo colon screening.  Surveillance recommendations for individuals with serrated polyposis include:  1. Colonoscopy with polypectomy until all polyps >/= 5 mm are removed, then colonoscopy every 1-3 years depending on the number and size of polyps.  Clearing of all polyps is preferable but not always possible. 2. Consider surgical referral if colonoscopic treatment and/or surveillance is inadequate or if high-grade dysplasia occurs.  Surveillance recommendations for individuals with a family history of serrated polyposis:  1. The risk of CRC in first-degree relatives of individuals with serrated polyposis is elevated. 2. First degree relatives are encouraged to have a colonoscopy at the earliest of the following:  Aged 28 years  Same age as youngest diagnosis of serrated polyposis if uncomplicated by cancer  10 years earlier than earliest diagnosis in family of CRC complicating serrated polyposis.  Following baseline exam, repeat every 5 years if no polyps are found.  If proximal serrated polyps or multiple adenomas are found, consider colonoscopy every 1-3 years.  Melanoma:  We discussed that about 5-10% of melanoma cases are hereditary with most cases due to CDKN2A mutations. As with breast cancer and other cancer syndromes, we look  for multiple diagnosis in a family or individual along with young ages of onset.  Her sister was diagnosed with melanoma at age 66.  This is young, even in a 'sun worshipper".  Melanoma syndromes can fall into Melanoma dominant (Melanoma is the primary cancer in the syndrome) and Melanoma subordinate (Melanoma can be seen in some families, but it  is not a primary cancer in the syndrome).  The [facility] panel looked at a few melanoma genes, but missed several.  Mariah Ruiz is negative for the CDKN2A gene.  There are several other genes that we would look at that are melanoma dominant - including BAP1, MITF and POT1.  We discussed that the best person to test for these is her sister, who was diagnosed so young.  There are no current testing guidelines for melanoma syndromes, so genetic testing for these conditions may or may not be covered by her insurance.  A benefits investigation will determine if her out of pocket cost will be high or if her insurance would cover genetic testing.  Breast Cancer: We discussed that about 5-10% of breast cancer is hereditary with most cases due to BRCA mutations.  Mariah Ruiz tested negative for deleterious mutations within BRCA1 or BRCA2, however, she thinks her maternal aunt had genetic testing that found a BRCA mutation.  She is unsure if that mutation is in BRCA1 or BRCA2.  We discussed that genetic testing results can be positive, negative or variant of uncertain significance (VUS).  If her aunt had a VUS, then this reportedly BRCA mutation is inconsequential at this time.  If it truly is a mutation then her mother and sister need to undergo genetic testing, as well as other family members.  We discussed that there is an elevated risk for breast cancer based on that potential mutation.  We addressed the issue that if the testing found a BRCA VUS then there would still be a residual risk for breast cancer based on the family history.  [facility] performs genetic screening for  breast cancer risk, using genetic factors identified in the patient's blood. This score indicates that she is at a higher risk for breast cancer, despite that she had negative genetic testing.  To help clarify this risk we performed a Harriett Rush as well.  Based on the patient's personal and family history, statistical models (Breast Cancer RiskScore ([facility]) and [employer])  and literature data were used to estimate her risk of developing breast cancer. These estimate her lifetime risk of developing breast cancer to be approximately 20.4% to 27.5%. The patient's lifetime breast cancer risk is a preliminary estimate based on available information using one of several models endorsed by the [place] (ACS). The ACS recommends consideration of breast MRI screening as an adjunct to mammography for patients at high risk (defined as 20% or greater lifetime risk). A more detailed breast cancer risk assessment can be considered, if clinically indicated.   Mariah Ruiz has been determined to be at high risk for breast cancer.  Therefore, we recommend that she consider annual screening with mammography and breast MRI.  We will refer Mariah Ruiz to the high risk breast clinic at [facility].  Someone will call and set that appointment up with her.  We discussed that Mariah Ruiz should discuss her individual situation with her referring physician and determine a breast cancer screening plan with which they are both comfortable.       RECOMMENDATIONS FOR FAMILY MEMBERS: Now that we know a specific deleterious mutation within the family, both men and women should be tested for this condition.  Her siblings and parents have a 50% chance of having this same mutation.  Single site testing can help sort out who in the family has the mutation and therefore needs increased screening, and those who do not.  That said, based  on the family history of colon cancer, individuals may need to be  followed more closely based on the family history, regardless of the genetic test results.    Based on Mariah Ruiz's family history, we recommended her mother have genetic counseling and testing for the possible BRCA mutation in the patient's maternal aunt, her brother, sister and parents undergo genetic testing for the RNF43 mutation identified in Mariah Ruiz, and her sister undergo genetic counseling and testing for possible melanoma genes. Mariah Ruiz will let us know if we can be of any assistance in coordinating genetic counseling and/or testing for this family member.   We discussed that some people do not want to undergo genetic testing due to fear of genetic discrimination.  A federal law called the Genetic Information Non-Discrimination Act (GINA) of 2008 helps protect individuals against genetic discrimination based on their genetic test results.  It impacts both health insurance and employment.  With health insurance, it protects against increased premiums, being kicked off insurance or being forced to take a test in order to be insured.  For employment it protects against hiring, firing and promoting decisions based on genetic test results.  Health status due to a cancer diagnosis is not protected under GINA.  PLAN: We discussed that Mariah Ruiz could:   1. Undergo genetic testing for melanoma genes through [facility].  Once this testing is completed, we could reflex to a larger genetic panel to cover the RNF43 gene.  This would allow family members to undergo genetic TESTING for free through [facility]'s family testing policy. [facility]'s testing policy does not influence the cost of the medical appointment to get genetic counseling and/or the blood drawn.  There could be a cost for that appointment.  Despite our recommendation, Mariah Ruiz did not wish to pursue genetic testing at today's visit. We understand this decision, and remain available to coordinate genetic testing at any time in the future.  We, therefore, recommend Mariah Ruiz continue to follow the cancer screening guidelines given by her primary healthcare provider.  2. Mariah Ruiz should try to get a copy of her aunt's genetic testing.  This would allow for [place] to more fully assess the family risk for breast cancer.  3. Mariah Ruiz should consider going to the High Risk Breast clinic based on her [facility] RiskScore and [place]. We will make this referral.  Lastly, we encouraged Ms. Widmann to remain in contact with cancer genetics annually so that we can continuously update the family history and inform her of any changes in cancer genetics and testing that may be of benefit for this family.   Ms.  Duarte questions were answered to her satisfaction today. Our contact information was provided should additional questions or concerns arise. Thank you for the referral and allowing [place] to share in the care of your patient.   Nachman Sundt P. Florene Glen, [place], [facility] Certified Genetic Counselor [email or web address] phone: [phone]  The patient was seen for a total of 138 minutes in face-to-face genetic counseling.  This patient was discussed with Drs. Magrinat, Lindi Adie and/or Burr Medico who agrees with the above.    _______________________________________________________________________ For Office Staff:  Number of people involved in session: 1 Was an Intern/ student involved with case: yes; Vanita Panda

## 2018-07-02 ENCOUNTER — Encounter: Payer: Self-pay | Admitting: Obstetrics & Gynecology

## 2018-07-02 ENCOUNTER — Encounter: Payer: Self-pay | Admitting: Genetic Counselor

## 2018-07-02 DIAGNOSIS — D126 Benign neoplasm of colon, unspecified: Secondary | ICD-10-CM | POA: Insufficient documentation

## 2018-07-05 ENCOUNTER — Telehealth: Payer: Self-pay | Admitting: Genetic Counselor

## 2018-07-05 NOTE — Telephone Encounter (Signed)
Lft vm to schedule Mariah Ruiz to be seen in the high risk breast clinic.

## 2018-11-28 ENCOUNTER — Other Ambulatory Visit: Payer: Self-pay | Admitting: General Practice

## 2018-11-28 DIAGNOSIS — Z20822 Contact with and (suspected) exposure to covid-19: Secondary | ICD-10-CM

## 2018-11-28 NOTE — Progress Notes (Signed)
n

## 2018-12-01 LAB — NOVEL CORONAVIRUS, NAA: SARS-CoV-2, NAA: NOT DETECTED

## 2019-01-14 ENCOUNTER — Other Ambulatory Visit: Payer: Self-pay | Admitting: Obstetrics and Gynecology

## 2019-01-14 DIAGNOSIS — Z1231 Encounter for screening mammogram for malignant neoplasm of breast: Secondary | ICD-10-CM

## 2019-02-26 ENCOUNTER — Other Ambulatory Visit: Payer: Self-pay

## 2019-02-26 ENCOUNTER — Ambulatory Visit
Admission: RE | Admit: 2019-02-26 | Discharge: 2019-02-26 | Disposition: A | Discharge status: Home / Self Care | Payer: BC Managed Care – PPO | Source: Ambulatory Visit | Attending: Obstetrics and Gynecology | Admitting: Obstetrics and Gynecology

## 2019-02-26 DIAGNOSIS — Z1231 Encounter for screening mammogram for malignant neoplasm of breast: Secondary | ICD-10-CM

## 2019-06-03 ENCOUNTER — Ambulatory Visit: Payer: BC Managed Care – PPO | Attending: Internal Medicine

## 2019-06-03 DIAGNOSIS — Z20822 Contact with and (suspected) exposure to covid-19: Secondary | ICD-10-CM | POA: Diagnosis not present

## 2019-06-04 LAB — NOVEL CORONAVIRUS, NAA: SARS-CoV-2, NAA: NOT DETECTED

## 2019-06-19 DIAGNOSIS — Z01419 Encounter for gynecological examination (general) (routine) without abnormal findings: Secondary | ICD-10-CM | POA: Diagnosis not present

## 2019-06-19 DIAGNOSIS — Z6820 Body mass index (BMI) 20.0-20.9, adult: Secondary | ICD-10-CM | POA: Diagnosis not present

## 2019-06-19 DIAGNOSIS — N926 Irregular menstruation, unspecified: Secondary | ICD-10-CM | POA: Diagnosis not present

## 2019-06-19 DIAGNOSIS — Z1151 Encounter for screening for human papillomavirus (HPV): Secondary | ICD-10-CM | POA: Diagnosis not present

## 2019-07-01 DIAGNOSIS — N950 Postmenopausal bleeding: Secondary | ICD-10-CM | POA: Diagnosis not present

## 2019-09-23 DIAGNOSIS — H5702 Anisocoria: Secondary | ICD-10-CM | POA: Diagnosis not present

## 2019-09-23 DIAGNOSIS — R519 Headache, unspecified: Secondary | ICD-10-CM | POA: Diagnosis not present

## 2020-02-17 DIAGNOSIS — Z0000 Encounter for general adult medical examination without abnormal findings: Secondary | ICD-10-CM | POA: Diagnosis not present

## 2020-03-15 DIAGNOSIS — N951 Menopausal and female climacteric states: Secondary | ICD-10-CM | POA: Diagnosis not present

## 2020-03-15 DIAGNOSIS — Z13220 Encounter for screening for lipoid disorders: Secondary | ICD-10-CM | POA: Diagnosis not present

## 2020-03-18 ENCOUNTER — Other Ambulatory Visit: Payer: Self-pay | Admitting: Obstetrics and Gynecology

## 2020-03-18 DIAGNOSIS — Z1231 Encounter for screening mammogram for malignant neoplasm of breast: Secondary | ICD-10-CM

## 2020-04-09 DIAGNOSIS — Z1159 Encounter for screening for other viral diseases: Secondary | ICD-10-CM | POA: Diagnosis not present

## 2020-04-16 DIAGNOSIS — Z1152 Encounter for screening for COVID-19: Secondary | ICD-10-CM | POA: Diagnosis not present

## 2020-04-28 ENCOUNTER — Ambulatory Visit: Payer: BC Managed Care – PPO

## 2020-06-07 DIAGNOSIS — F419 Anxiety disorder, unspecified: Secondary | ICD-10-CM | POA: Diagnosis not present

## 2020-06-08 ENCOUNTER — Other Ambulatory Visit: Payer: Self-pay

## 2020-06-08 ENCOUNTER — Ambulatory Visit
Admission: RE | Admit: 2020-06-08 | Discharge: 2020-06-08 | Disposition: A | Discharge status: Home / Self Care | Payer: BC Managed Care – PPO | Source: Ambulatory Visit | Attending: Obstetrics and Gynecology | Admitting: Obstetrics and Gynecology

## 2020-06-08 DIAGNOSIS — Z1231 Encounter for screening mammogram for malignant neoplasm of breast: Secondary | ICD-10-CM

## 2020-06-14 DIAGNOSIS — D2239 Melanocytic nevi of other parts of face: Secondary | ICD-10-CM | POA: Diagnosis not present

## 2020-06-14 DIAGNOSIS — L570 Actinic keratosis: Secondary | ICD-10-CM | POA: Diagnosis not present

## 2020-06-14 DIAGNOSIS — D224 Melanocytic nevi of scalp and neck: Secondary | ICD-10-CM | POA: Diagnosis not present

## 2020-06-14 DIAGNOSIS — L814 Other melanin hyperpigmentation: Secondary | ICD-10-CM | POA: Diagnosis not present

## 2020-06-24 DIAGNOSIS — F419 Anxiety disorder, unspecified: Secondary | ICD-10-CM | POA: Diagnosis not present

## 2020-09-23 ENCOUNTER — Encounter: Payer: Self-pay | Admitting: *Deleted

## 2020-09-23 NOTE — Progress Notes (Signed)
Ammended My Risk lab mailed to pt's home address.

## 2020-11-30 DIAGNOSIS — Z113 Encounter for screening for infections with a predominantly sexual mode of transmission: Secondary | ICD-10-CM | POA: Diagnosis not present

## 2020-11-30 DIAGNOSIS — Z01419 Encounter for gynecological examination (general) (routine) without abnormal findings: Secondary | ICD-10-CM | POA: Diagnosis not present

## 2020-11-30 DIAGNOSIS — Z124 Encounter for screening for malignant neoplasm of cervix: Secondary | ICD-10-CM | POA: Diagnosis not present

## 2020-11-30 DIAGNOSIS — N941 Unspecified dyspareunia: Secondary | ICD-10-CM | POA: Diagnosis not present

## 2020-11-30 DIAGNOSIS — Z6820 Body mass index (BMI) 20.0-20.9, adult: Secondary | ICD-10-CM | POA: Diagnosis not present

## 2020-11-30 DIAGNOSIS — N950 Postmenopausal bleeding: Secondary | ICD-10-CM | POA: Diagnosis not present

## 2021-01-25 DIAGNOSIS — N950 Postmenopausal bleeding: Secondary | ICD-10-CM | POA: Diagnosis not present

## 2021-03-16 ENCOUNTER — Ambulatory Visit: Payer: Self-pay | Admitting: Podiatry

## 2021-04-02 DIAGNOSIS — R051 Acute cough: Secondary | ICD-10-CM | POA: Diagnosis not present

## 2021-10-31 DIAGNOSIS — Z1231 Encounter for screening mammogram for malignant neoplasm of breast: Secondary | ICD-10-CM | POA: Diagnosis not present

## 2021-12-02 DIAGNOSIS — Z6820 Body mass index (BMI) 20.0-20.9, adult: Secondary | ICD-10-CM | POA: Diagnosis not present

## 2021-12-02 DIAGNOSIS — Z01419 Encounter for gynecological examination (general) (routine) without abnormal findings: Secondary | ICD-10-CM | POA: Diagnosis not present

## 2021-12-28 ENCOUNTER — Encounter: Payer: Self-pay | Admitting: Internal Medicine

## 2022-01-17 ENCOUNTER — Encounter: Payer: Self-pay | Admitting: Podiatry

## 2022-01-17 ENCOUNTER — Ambulatory Visit (INDEPENDENT_AMBULATORY_CARE_PROVIDER_SITE_OTHER): Payer: BC Managed Care – PPO | Admitting: Podiatry

## 2022-01-17 ENCOUNTER — Ambulatory Visit (INDEPENDENT_AMBULATORY_CARE_PROVIDER_SITE_OTHER): Payer: BC Managed Care – PPO

## 2022-01-17 DIAGNOSIS — M2012 Hallux valgus (acquired), left foot: Secondary | ICD-10-CM

## 2022-01-17 DIAGNOSIS — M2010 Hallux valgus (acquired), unspecified foot: Secondary | ICD-10-CM

## 2022-01-17 DIAGNOSIS — L603 Nail dystrophy: Secondary | ICD-10-CM | POA: Diagnosis not present

## 2022-01-17 DIAGNOSIS — M2011 Hallux valgus (acquired), right foot: Secondary | ICD-10-CM | POA: Diagnosis not present

## 2022-01-17 NOTE — Progress Notes (Signed)
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 female presents with the above complaint.   ROS: Denies fever chills nausea vomiting muscle aches pains calf pain back pain chest pain shortness of breath.  Past Medical History:  Diagnosis Date   External hemorrhoids    Family history of breast cancer    Family history of colon cancer    Family history of melanoma    Mass of axillary tail of left breast    Past Surgical History:  Procedure Laterality Date   CESAREAN SECTION     TUBAL LIGATION      Current Outpatient Medications:    estradiol (ESTRACE) 0.1 MG/GM vaginal cream, Place vaginally., Disp: , Rfl:   Current Facility-Administered Medications:    0.9 %  sodium chloride infusion, 500 mL, Intravenous, Continuous, Irene Shipper, MD  Allergies  Allergen Reactions   Shrimp [Shellfish Allergy]    Latex Rash    Latex was used with her last delivery and she had no problems   Review of Systems Objective:  There were no vitals filed for this visit.  General: Well developed, nourished, in no acute distress, alert and oriented x3   Dermatological: Skin is warm, dry and supple bilateral. Nails x 10 are well maintained; remaining integument appears unremarkable at this time. There are no open sores, no preulcerative lesions, no rash or signs of infection present.  Toenails are slightly thick yellow dystrophic and clinically mycotic  Vascular: Dorsalis Pedis artery and Posterior Tibial artery pedal pulses are 2/4 bilateral with immedate capillary fill time. Pedal hair growth present. No varicosities and no lower extremity edema present bilateral.    Neruologic: Grossly intact via light touch bilateral. Vibratory intact via tuning fork bilateral. Protective threshold with Semmes Wienstein monofilament intact to all pedal sites bilateral. Patellar and Achilles deep tendon reflexes 2+ bilateral. No Babinski or clonus noted bilateral.   Musculoskeletal: No gross boney pedal deformities bilateral. No pain, crepitus, or limitation noted with foot and ankle range of motion bilateral. Muscular strength 5/5 in all groups tested bilateral.  Hallux abductovalgus is noted.  Left foot appears to be worse than the right hypermobility at the first tarsometatarsal joint is resulting in her valgus rotation and moderate to severe bunion deformities.  There is no crepitation on range of motion and easily reducible.  Once reduced rectus position is obtained.  Gait: Unassisted, Nonantalgic.    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  She does have some spurring to the dorsal aspect of the right head of the first metatarsal.  No other significant abnormalities identified.  Assessment & Plan:   Assessment: Nail dystrophy bilateral foot.  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  We did discuss a [place] plasty which Dr. Sherryle Lis would need to perform.  I will refer her to him in the near future for surgical consult.  Plan: Surgical consult with Dr. Sherryle Lis.  Patient is also going to bring samples of her toenails and for me to send for pathologic evaluation.     Magic Mohler T. [place],  [place]

## 2022-01-18 DIAGNOSIS — L814 Other melanin hyperpigmentation: Secondary | ICD-10-CM | POA: Diagnosis not present

## 2022-01-18 DIAGNOSIS — D229 Melanocytic nevi, unspecified: Secondary | ICD-10-CM | POA: Diagnosis not present

## 2022-01-18 DIAGNOSIS — L578 Other skin changes due to chronic exposure to nonionizing radiation: Secondary | ICD-10-CM | POA: Diagnosis not present

## 2022-01-18 DIAGNOSIS — L821 Other seborrheic keratosis: Secondary | ICD-10-CM | POA: Diagnosis not present

## 2022-02-14 ENCOUNTER — Ambulatory Visit: Payer: BC Managed Care – PPO | Admitting: Podiatry

## 2022-06-06 ENCOUNTER — Encounter: Payer: Self-pay | Admitting: Internal Medicine

## 2022-07-04 ENCOUNTER — Ambulatory Visit (AMBULATORY_SURGERY_CENTER): Payer: BC Managed Care – PPO | Admitting: *Deleted

## 2022-07-04 VITALS — Ht 66.0 in | Wt 125.0 lb

## 2022-07-04 DIAGNOSIS — Z83711 Family history of hyperplastic colon polyps: Secondary | ICD-10-CM

## 2022-07-04 MED ORDER — NA SULFATE-K SULFATE-MG SULF 17.5-3.13-1.6 GM/177ML PO SOLN: 1.0000 | Freq: Once | ORAL | 0 refills | Status: AC

## 2022-07-04 NOTE — Progress Notes (Signed)
No egg or soy allergy known to patient  No issues known to pt with past sedation with any surgeries or procedures Patient denies ever being intubated No issues moving head or neck No issues with swallowing  No FH of Malignant Hyperthermia Pt is not on diet pills Pt is not on  home 02  Pt is not on blood thinners  Pt denies issues with constipation  Pt is not on dialysis Pt denies any upcoming cardiac testing Pt encouraged to use to use Singlecare or Goodrx to reduce cost  Patient's chart reviewed by Mariah Ruiz CNRA prior to previsit and patient appropriate for the [place].  Previsit completed and red dot placed by patient's name on their procedure day (on provider's schedule).  . Visit by phone Instructions reviewed with pt and pt states understanding. Instructed to review again prior to procedure. Pt states they will.

## 2022-07-07 ENCOUNTER — Encounter: Payer: Self-pay | Admitting: Internal Medicine

## 2022-07-24 ENCOUNTER — Encounter: Payer: BC Managed Care – PPO | Admitting: Internal Medicine

## 2022-07-24 ENCOUNTER — Telehealth: Payer: Self-pay | Admitting: Internal Medicine

## 2022-07-24 NOTE — Telephone Encounter (Signed)
Patient rescheduled please update prep

## 2022-07-24 NOTE — Telephone Encounter (Signed)
New prep instructions sent via mychart. Paper copy mailed to the address on file.

## 2022-08-16 ENCOUNTER — Encounter: Payer: BC Managed Care – PPO | Admitting: Internal Medicine

## 2022-08-30 ENCOUNTER — Ambulatory Visit (AMBULATORY_SURGERY_CENTER): Payer: BC Managed Care – PPO | Admitting: Internal Medicine

## 2022-08-30 ENCOUNTER — Encounter: Payer: Self-pay | Admitting: Internal Medicine

## 2022-08-30 VITALS — BP 97/65 | HR 69 | Temp 98.6°F | Resp 10 | Ht 66.0 in | Wt 125.0 lb

## 2022-08-30 DIAGNOSIS — Z1211 Encounter for screening for malignant neoplasm of colon: Secondary | ICD-10-CM

## 2022-08-30 DIAGNOSIS — Z800 Family history of malignant neoplasm of digestive organs: Secondary | ICD-10-CM

## 2022-08-30 MED ORDER — SODIUM CHLORIDE 0.9 % IV SOLN: 500.0000 mL | Freq: Once | INTRAVENOUS | Status: DC

## 2022-08-30 NOTE — Patient Instructions (Signed)
Resume previous diet and medications. Repeat Colonoscopy in 5 years for surveillance.  Handout provided on Diverticulosis  YOU HAD AN ENDOSCOPIC PROCEDURE TODAY AT THE [facility]:   Refer to the procedure report that was given to you for any specific questions about what was found during the examination.  If the procedure report does not answer your questions, please call your gastroenterologist to clarify.  If you requested that your care partner not be given the details of your procedure findings, then the procedure report has been included in a sealed envelope for you to review at your convenience later.  YOU SHOULD EXPECT: Some feelings of bloating in the abdomen. Passage of more gas than usual.  Walking can help get rid of the air that was put into your GI tract during the procedure and reduce the bloating. If you had a lower endoscopy (such as a colonoscopy or flexible sigmoidoscopy) you may notice spotting of blood in your stool or on the toilet paper. If you underwent a bowel prep for your procedure, you may not have a normal bowel movement for a few days.  Please Note:  You might notice some irritation and congestion in your nose or some drainage.  This is from the oxygen used during your procedure.  There is no need for concern and it should clear up in a day or so.  SYMPTOMS TO REPORT IMMEDIATELY:  Following lower endoscopy (colonoscopy or flexible sigmoidoscopy):  Excessive amounts of blood in the stool  Significant tenderness or worsening of abdominal pains  Swelling of the abdomen that is new, acute  Fever of 100F or higher  For urgent or emergent issues, a gastroenterologist can be reached at any hour by calling [phone]. Do not use MyChart messaging for urgent concerns.    DIET:  We do recommend a small meal at first, but then you may proceed to your regular diet.  Drink plenty of fluids but you should avoid alcoholic beverages for 24 hours.  ACTIVITY:  You  should plan to take it easy for the rest of today and you should NOT DRIVE or use heavy machinery until tomorrow (because of the sedation medicines used during the test).    FOLLOW UP: Our staff will call the number listed on your records the next business day following your procedure.  We will call around 7:15- 8:00 am to check on you and address any questions or concerns that you may have regarding the information given to you following your procedure. If we do not reach you, we will leave a message.     If any biopsies were taken you will be contacted by phone or by letter within the next 1-3 weeks.  Please call us at [phone] if you have not heard about the biopsies in 3 weeks.    SIGNATURES/CONFIDENTIALITY: You and/or your care partner have signed paperwork which will be entered into your electronic medical record.  These signatures attest to the fact that that the information above on your After Visit Summary has been reviewed and is understood.  Full responsibility of the confidentiality of this discharge information lies with you and/or your care-partner. 

## 2022-08-30 NOTE — Progress Notes (Signed)
HISTORY OF PRESENT ILLNESS:  Mariah Ruiz is a 52 y.o. female with a family history of colon cancer in her brother age 28.  Previous examinations 2010 and 2018 were negative for neoplasia.  Now for follow-up high risk screening  REVIEW OF SYSTEMS:  All non-GI ROS negative except for  Past Medical History:  Diagnosis Date   External hemorrhoids    Family history of breast cancer    Family history of colon cancer    Family history of melanoma    Mass of axillary tail of left breast     Past Surgical History:  Procedure Laterality Date   CESAREAN SECTION     TUBAL LIGATION      Social History Mariah Ruiz  reports that she has never smoked. She has never used smokeless tobacco. She reports current alcohol use of about 2.0 standard drinks of alcohol per week. She reports that she does not use drugs.  family history includes Breast cancer (age of onset: 78) in her maternal aunt, maternal aunt, and paternal aunt; Breast cancer (age of onset: 45) in her paternal aunt; Cancer in her maternal grandmother; Colitis in her son; Colon cancer (age of onset: 20) in her paternal grandmother; Colon cancer (age of onset: 48) in her brother; Colon polyps in her father; Melanoma (age of onset: 76) in her sister; Stroke in her father; Ulcerative colitis (age of onset: 64) in her son.  Allergies  Allergen Reactions   Shrimp [Shellfish Allergy]    Latex Rash    Latex was used with her last delivery and she had no problems       PHYSICAL EXAMINATION: Vital signs: BP 133/72   Pulse 82   Temp 98.6 F (37 C) (Temporal)   Resp 15   Ht  (1.676 m)   Wt 125 lb (56.7 kg)   LMP 06/01/2020   SpO2 100%   BMI 20.18 kg/m  General: Well-developed, well-nourished, no acute distress HEENT: Sclerae are anicteric, conjunctiva pink. Oral mucosa intact Lungs: Clear Heart: Regular Abdomen: soft, nontender, nondistended, no obvious ascites, no peritoneal signs, normal bowel sounds. No  organomegaly. Extremities: No edema Psychiatric: alert and oriented x3. Cooperative     ASSESSMENT:  Colon cancer screening Family history of colon cancer   PLAN:  Screening colonoscopy

## 2022-08-30 NOTE — Progress Notes (Signed)
Pt's states no medical or surgical changes since previsit or office visit. 

## 2022-08-30 NOTE — Progress Notes (Signed)
Vss nad trans to pacu 

## 2022-08-30 NOTE — Op Note (Signed)
[facility] Patient Name: Mariah Ruiz Procedure Date: 08/30/2022 2:38 PM MRN: [id number] Endoscopist: Wilhemina Bonito. Marina Goodell , MD, [id number] Age: 52 Referring MD:  Date of Birth: [birth year] Gender: Female Account #: [id number] Procedure:                Colonoscopy Indications:              Screening in patient at increased risk: Colorectal                            cancer in brother before age 93. Previous                            examinations 2010, 2018 were negative for neoplasia Medicines:                Monitored Anesthesia Care Procedure:                Pre-Anesthesia Assessment:                           - Prior to the procedure, a History and Physical                            was performed, and patient medications and                            allergies were reviewed. The patient's tolerance of                            previous anesthesia was also reviewed. The risks                            and benefits of the procedure and the sedation                            options and risks were discussed with the patient.                            All questions were answered, and informed consent                            was obtained. Prior Anticoagulants: The patient has                            taken no anticoagulant or antiplatelet agents. ASA                            Grade Assessment: I - A normal, healthy patient.                            After reviewing the risks and benefits, the patient                            was deemed in satisfactory condition to undergo the  procedure.                           After obtaining informed consent, the colonoscope                            was passed under direct vision. Throughout the                            procedure, the patient's blood pressure, pulse, and                            oxygen saturations were monitored continuously. The                            CF [id number] #[id number] was  introduced through the anus                            and advanced to the the cecum, identified by                            appendiceal orifice and ileocecal valve. The                            ileocecal valve, appendiceal orifice, and rectum                            were photographed. The quality of the bowel                            preparation was excellent. The colonoscopy was                            performed without difficulty. The patient tolerated                            the procedure well. The bowel preparation used was                            SUPREP via split dose instruction. Scope In: 2:53:41 PM Scope Out: 3:05:57 PM Scope Withdrawal Time: 0 hours 9 minutes 8 seconds  Total Procedure Duration: 0 hours 12 minutes 16 seconds  Findings:                 Multiple diverticula were found in the sigmoid                            colon and ascending colon.                           The exam was otherwise without abnormality on                            direct and retroflexion views. Complications:            No immediate complications.  Estimated blood loss:                            None. Estimated Blood Loss:     Estimated blood loss: none. Impression:               - Diverticulosis in the sigmoid colon and in the                            ascending colon.                           - The examination was otherwise normal on direct                            and retroflexion views.                           - No specimens collected. Recommendation:           - Repeat colonoscopy in 5 years for screening                            purposes (family history).                           - Patient has a contact number available for                            emergencies. The signs and symptoms of potential                            delayed complications were discussed with the                            patient. Return to normal activities tomorrow.                             Written discharge instructions were provided to the                            patient.                           - Resume previous diet.                           - Continue present medications. Wilhemina Bonito. Marina Goodell, MD 08/30/2022 3:09:52 PM This report has been signed electronically.

## 2022-08-31 ENCOUNTER — Telehealth: Payer: Self-pay | Admitting: *Deleted

## 2022-08-31 NOTE — Telephone Encounter (Signed)
  Follow up Call-     08/30/2022    1:55 PM  Call back number  Post procedure Call Back phone  # [phone]  Permission to leave phone message Yes     Patient questions:  Do you have a fever, pain , or abdominal swelling? No. Pain Score  0 *  Have you tolerated food without any problems? Yes.    Have you been able to return to your normal activities? Yes.    Do you have any questions about your discharge instructions: Diet   No. Medications  No. Follow up visit  No.  Do you have questions or concerns about your Care? No.  Actions: * If pain score is 4 or above: No action needed, pain <4.

## 2022-11-22 IMAGING — MG MM DIGITAL SCREENING BILAT W/ TOMO AND CAD
8 series · 9 of 24 positions shown · non-contrast
Comparison: Previous exam(s).

CLINICAL DATA: Screening.

EXAM:
DIGITAL SCREENING BILATERAL MAMMOGRAM WITH TOMO AND CAD

[L MLO synth-2D]
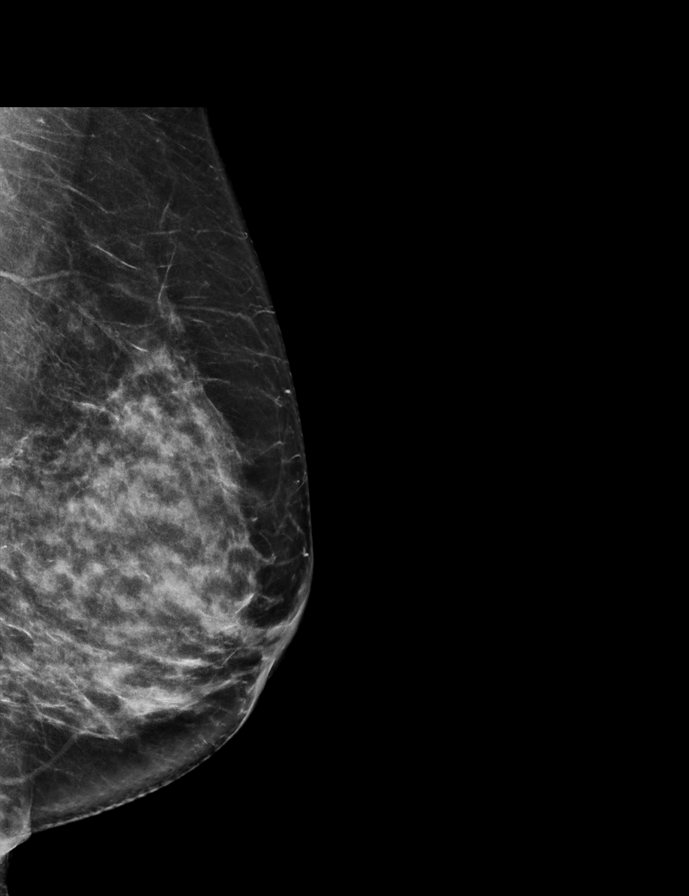

[R MLO synth-2D]
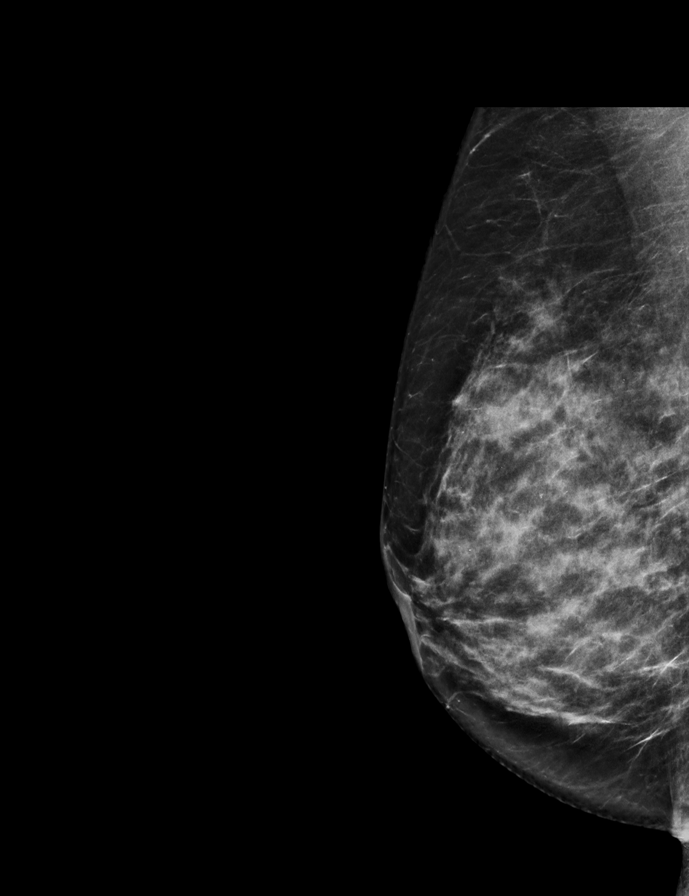

[L CC synth-2D]
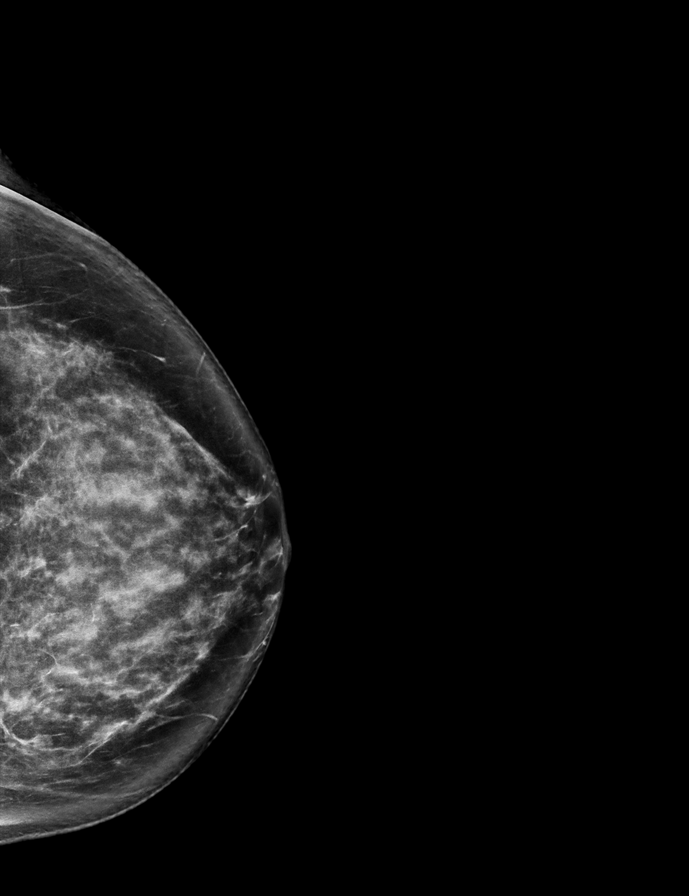

[R CC synth-2D]
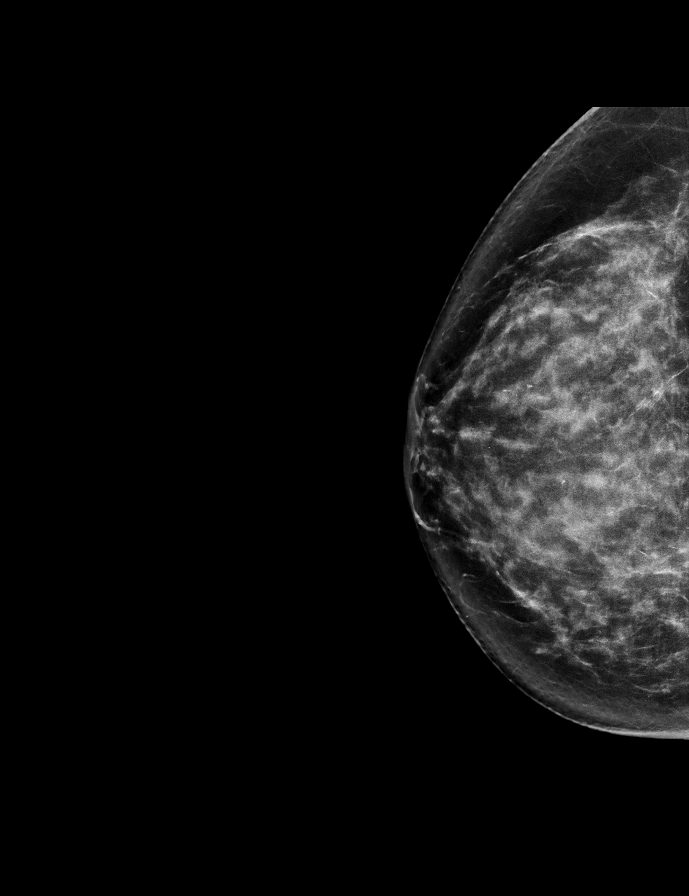

[L MLO tomo · 2 of 69 frames shown]
[frame 23/69]
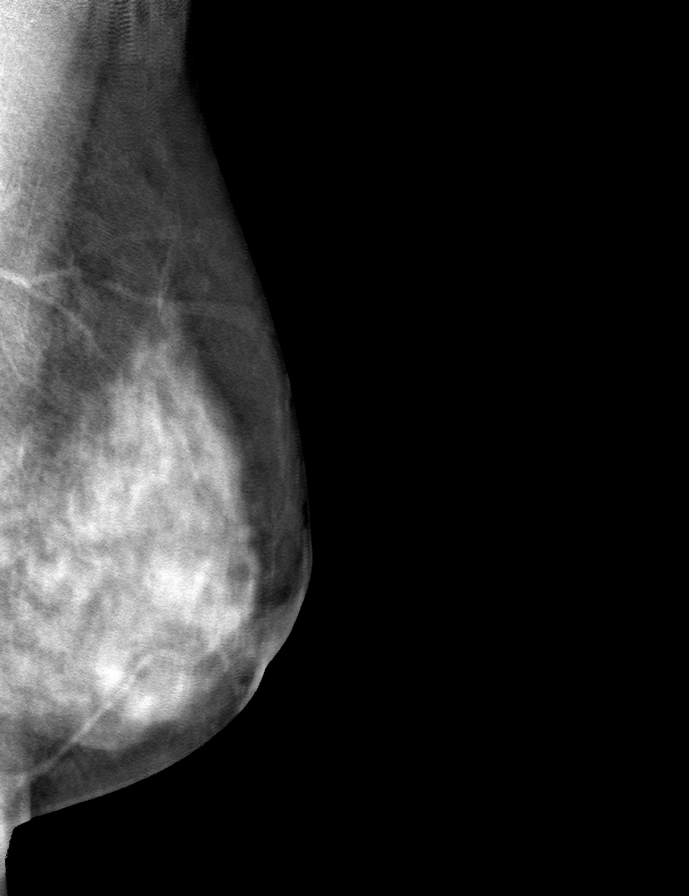
[frame 35/69]
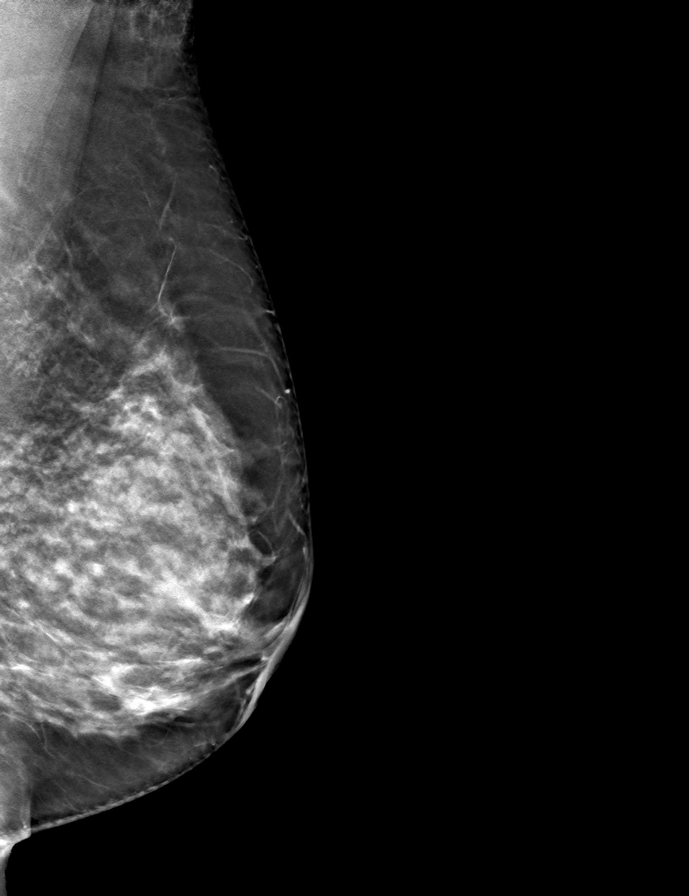

[R MLO tomo · tomo slice 34/67.0]
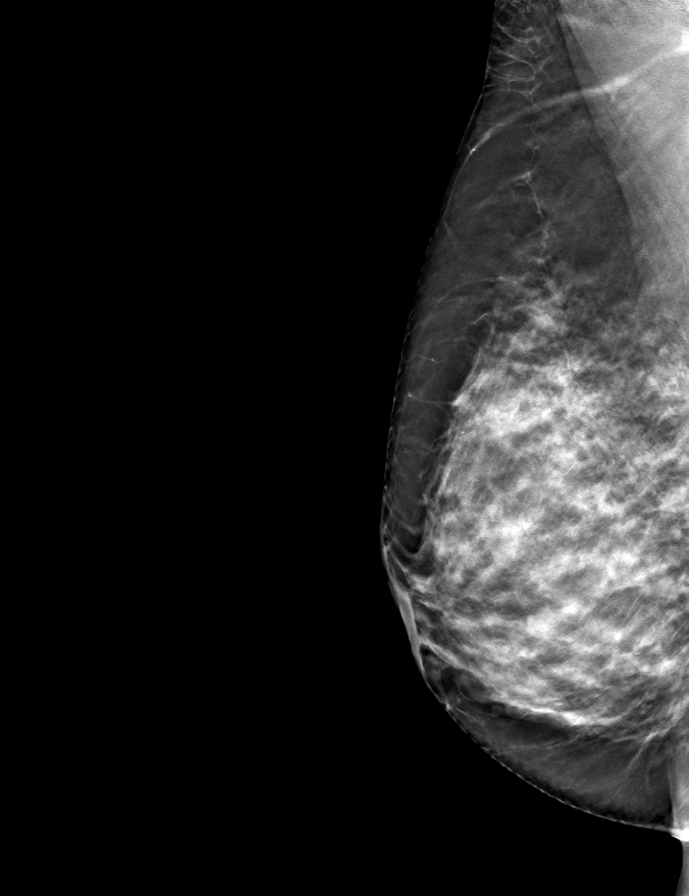

[R CC tomo · tomo slice 35/69.0]
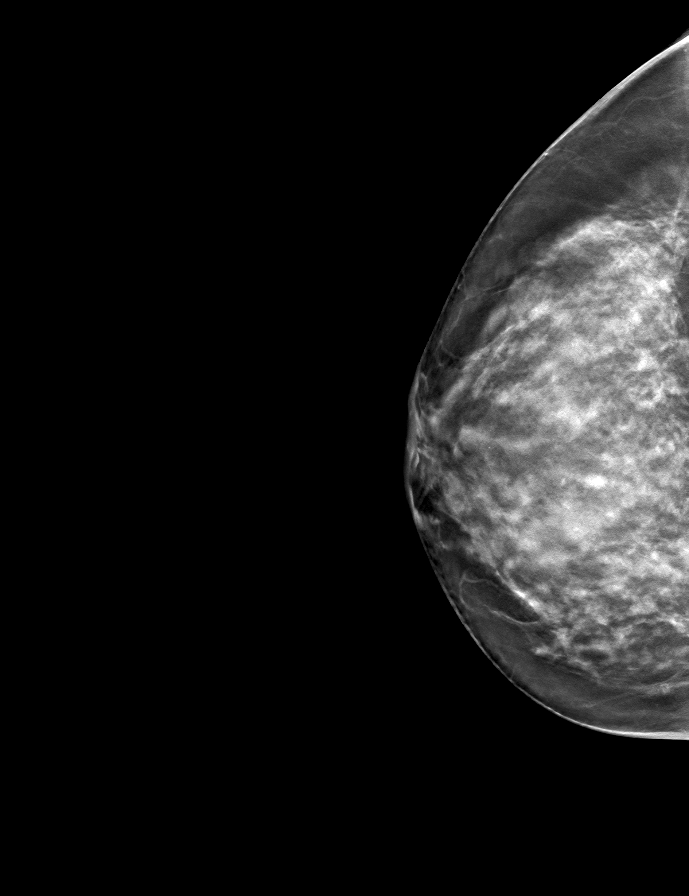

[L CC tomo · tomo slice 37/72.0]
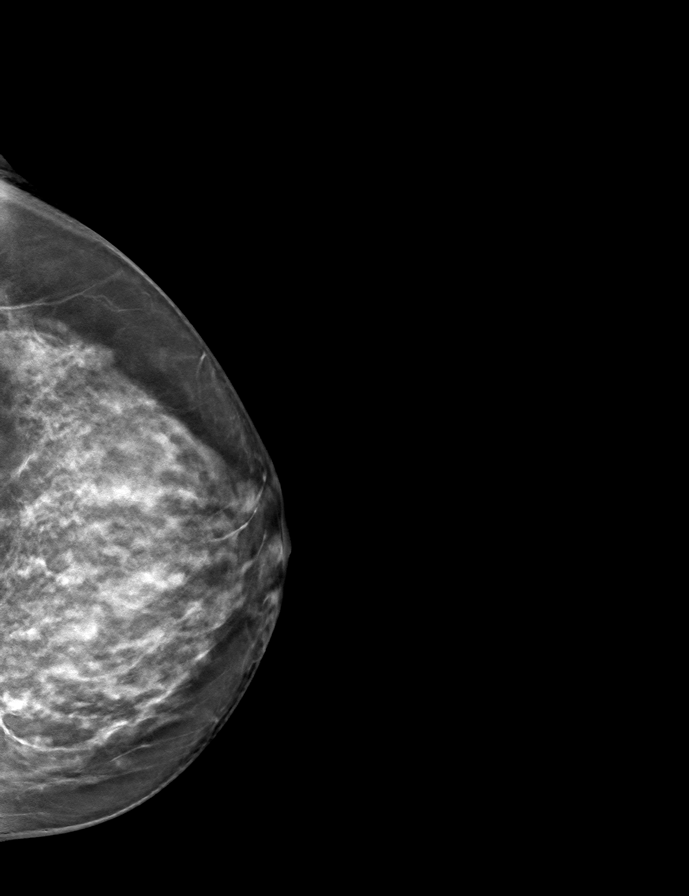

[9 of 24 positions shown; findings below may reference images not displayed]

ACR Breast Density Category d: The breast tissue is extremely dense,
which lowers the sensitivity of mammography
FINDINGS: There are no findings suspicious for malignancy. The images were
evaluated with computer-aided detection.
IMPRESSION: No mammographic evidence of malignancy. A result letter of this
screening mammogram will be mailed directly to the patient.

RECOMMENDATION:
Screening mammogram in one year. (Code:S5-Y-XW9)

BI-RADS CATEGORY  1: Negative.

## 2024-04-14 ENCOUNTER — Encounter: Payer: Self-pay | Admitting: Obstetrics & Gynecology

## 2024-05-15 ENCOUNTER — Telehealth: Payer: Self-pay

## 2024-05-15 NOTE — Telephone Encounter (Signed)
 RN received voicemail from nurse line in regards to amended 2019 report with message to ensure provider had received updated report. RN confirmed with [facility] report received and sent to provider.  Silvano LELON Piano, RN
